# Patient Record
Sex: Male | Born: 1955 | Hispanic: No | Marital: Married | Smoking: Current every day smoker
Health system: Southern US, Community
[De-identification: ages and names within clinical notes are randomized; demographics above are authoritative.]

## PROBLEM LIST (undated history)

## (undated) DIAGNOSIS — M109 Gout, unspecified: Secondary | ICD-10-CM

## (undated) DIAGNOSIS — I10 Essential (primary) hypertension: Secondary | ICD-10-CM

---

## 2013-08-09 ENCOUNTER — Emergency Department (HOSPITAL_COMMUNITY)
Admission: EM | Admit: 2013-08-09 | Discharge: 2013-08-09 | Disposition: A | Discharge status: Home / Self Care | Payer: Self-pay | Attending: Emergency Medicine | Admitting: Emergency Medicine

## 2013-08-09 ENCOUNTER — Encounter (HOSPITAL_COMMUNITY): Payer: Self-pay | Admitting: Emergency Medicine

## 2013-08-09 ENCOUNTER — Emergency Department (HOSPITAL_COMMUNITY): Payer: Self-pay

## 2013-08-09 DIAGNOSIS — IMO0002 Reserved for concepts with insufficient information to code with codable children: Secondary | ICD-10-CM | POA: Insufficient documentation

## 2013-08-09 DIAGNOSIS — Z79899 Other long term (current) drug therapy: Secondary | ICD-10-CM | POA: Insufficient documentation

## 2013-08-09 DIAGNOSIS — F17200 Nicotine dependence, unspecified, uncomplicated: Secondary | ICD-10-CM | POA: Insufficient documentation

## 2013-08-09 DIAGNOSIS — I10 Essential (primary) hypertension: Secondary | ICD-10-CM | POA: Insufficient documentation

## 2013-08-09 DIAGNOSIS — R55 Syncope and collapse: Secondary | ICD-10-CM | POA: Insufficient documentation

## 2013-08-09 DIAGNOSIS — M109 Gout, unspecified: Secondary | ICD-10-CM | POA: Insufficient documentation

## 2013-08-09 DIAGNOSIS — R42 Dizziness and giddiness: Secondary | ICD-10-CM

## 2013-08-09 DIAGNOSIS — Z791 Long term (current) use of non-steroidal anti-inflammatories (NSAID): Secondary | ICD-10-CM | POA: Insufficient documentation

## 2013-08-09 DIAGNOSIS — R911 Solitary pulmonary nodule: Secondary | ICD-10-CM | POA: Insufficient documentation

## 2013-08-09 HISTORY — DX: Gout, unspecified: M10.9

## 2013-08-09 HISTORY — DX: Essential (primary) hypertension: I10

## 2013-08-09 LAB — CBC WITH DIFFERENTIAL/PLATELET
Basophils Absolute: 0 10*3/uL (ref 0.0–0.1)
Basophils Relative: 0 % (ref 0–1)
EOS PCT: 2 % (ref 0–5)
Eosinophils Absolute: 0.1 10*3/uL (ref 0.0–0.7)
HCT: 43.7 % (ref 39.0–52.0)
Hemoglobin: 15.7 g/dL (ref 13.0–17.0)
LYMPHS ABS: 1.4 10*3/uL (ref 0.7–4.0)
Lymphocytes Relative: 23 % (ref 12–46)
MCH: 33.1 pg (ref 26.0–34.0)
MCHC: 35.9 g/dL (ref 30.0–36.0)
MCV: 92.2 fL (ref 78.0–100.0)
MONO ABS: 0.6 10*3/uL (ref 0.1–1.0)
Monocytes Relative: 10 % (ref 3–12)
Neutro Abs: 4 10*3/uL (ref 1.7–7.7)
Neutrophils Relative %: 65 % (ref 43–77)
Platelets: 196 10*3/uL (ref 150–400)
RBC: 4.74 MIL/uL (ref 4.22–5.81)
RDW: 13.3 % (ref 11.5–15.5)
WBC: 6.1 10*3/uL (ref 4.0–10.5)

## 2013-08-09 LAB — BASIC METABOLIC PANEL
BUN: 15 mg/dL (ref 6–23)
CALCIUM: 8.5 mg/dL (ref 8.4–10.5)
CO2: 21 mEq/L (ref 19–32)
CREATININE: 0.97 mg/dL (ref 0.50–1.35)
Chloride: 103 mEq/L (ref 96–112)
GFR calc Af Amer: >90 mL/min (ref >90)
GLUCOSE: 116 mg/dL — AB (ref 70–99)
Potassium: 4.3 mEq/L (ref 3.7–5.3)
Sodium: 138 mEq/L (ref 137–147)

## 2013-08-09 LAB — TROPONIN I: Troponin I: <0.3 ng/mL (ref <0.30)

## 2013-08-09 MED ORDER — SODIUM CHLORIDE 0.9 % IV BOLUS (SEPSIS)
1000.0000 mL | Freq: Once | INTRAVENOUS | Status: AC
  Administered 2013-08-09: 1000 mL via INTRAVENOUS

## 2013-08-09 MED ORDER — ONDANSETRON HCL 4 MG/2ML IJ SOLN
4.0000 mg | Freq: Once | INTRAMUSCULAR | Status: AC
  Administered 2013-08-09: 4 mg via INTRAVENOUS
  Filled 2013-08-09: qty 2

## 2013-08-09 MED ORDER — MECLIZINE HCL 25 MG PO TABS
25.0000 mg | ORAL_TABLET | Freq: Once | ORAL | Status: AC
  Administered 2013-08-09: 25 mg via ORAL
  Filled 2013-08-09: qty 1

## 2013-08-09 MED ORDER — MECLIZINE HCL 25 MG PO TABS: 25.0000 mg | ORAL_TABLET | Freq: Four times a day (QID) | ORAL | Status: AC

## 2013-08-09 NOTE — ED Notes (Signed)
Pt able to ambulate up and down hallway with steady gait, denies dizziness at this time.

## 2013-08-09 NOTE — ED Notes (Signed)
Lab called, stated BMP hemolyzed. To be redrawn.

## 2013-08-09 NOTE — ED Provider Notes (Signed)
Medical screening examination/treatment/procedure(s) were performed by non-physician practitioner and as supervising physician I was immediately available for consultation/collaboration.  Cherri Yera E Ofelia Podolski, MD 08/09/13 2210 

## 2013-08-09 NOTE — ED Provider Notes (Signed)
CSN: [id number]     Arrival date & time 08/09/13  1719 History   First MD Initiated Contact with Patient 08/09/13 1727     Chief Complaint  Patient presents with  . Dizziness   (Consider location/radiation/quality/duration/timing/severity/associated sxs/prior Treatment) HPI  Patient presents to the ER by EMS with complaints of dizziness. He says that while he was sitting down watching television he started to get really dizzy as if he was rocking on a boat. He also felt as though his vision was getting fuzzy and he felt "not right". He became concerned and called the ambulance. He admits that this has happened to him before in the past. He denies passing out of being able to ambulate. He denies change in gait, weakness to one or two extremities. He denies confusion or aphagia. He denies recent illness, SOB, CP, vomiting, diarrhea, dysuria. He says he smokes but is otherwise pretty healthy.   Pt does admit that his dog of 15 years died yesterday and his family life has been stressful.   Past Medical History  Diagnosis Date  . Hypertension   . Gout    History reviewed. No pertinent past surgical history. Family History  Problem Relation Age of Onset  . Family history unknown: Yes   History  Substance Use Topics  . Smoking status: Current Every Day Smoker -- 1.00 packs/day    Types: Cigarettes  . Smokeless tobacco: Not on file  . Alcohol Use: Not on file    Review of Systems The patient denies anorexia, fever, weight loss,, vision loss, decreased hearing, hoarseness, chest pain, syncope, dyspnea on exertion, peripheral edema, balance deficits, hemoptysis, abdominal pain, melena, hematochezia, severe indigestion/heartburn, hematuria, incontinence, genital sores, muscle weakness, suspicious skin lesions, transient blindness, difficulty walking, depression, unusual weight change, abnormal bleeding, enlarged lymph nodes, angioedema, and breast masses.  Allergies  Review of patient's  allergies indicates no known allergies.  Home Medications   Current Outpatient Rx  Name  Route  Sig  Dispense  Refill  . betamethasone dipropionate (DIPROLENE) 0.05 % cream   Topical   Apply 1 application topically daily.         . indomethacin (INDOCIN) 50 MG capsule   Oral   Take 50 mg by mouth 3 (three) times daily between meals.         . metoprolol succinate (TOPROL-XL) 100 MG 24 hr tablet   Oral   Take 100 mg by mouth daily. Take with or immediately following a meal.         . meclizine (ANTIVERT) 25 MG tablet   Oral   Take 1 tablet (25 mg total) by mouth 4 (four) times daily.   14 tablet   0    BP 139/76  Pulse 68  Resp 16  SpO2 100% Physical Exam  Nursing note and vitals reviewed. Constitutional: He is oriented to person, place, and time. He appears well-developed and well-nourished. No distress.  HENT:  Head: Normocephalic and atraumatic.  Eyes: Pupils are equal, round, and reactive to light.  Neck: Normal range of motion. Neck supple.  Cardiovascular: Normal rate and regular rhythm.   Pulmonary/Chest: Effort normal.  Abdominal: Soft.  Neurological: He is alert and oriented to person, place, and time. He has normal strength. No cranial nerve deficit or sensory deficit. He displays a negative Romberg sign. GCS eye subscore is 4. GCS verbal subscore is 5. GCS motor subscore is 6.  Skin: Skin is warm and dry.    ED Course  Procedures (including critical care time) Labs Review Labs Reviewed  BASIC METABOLIC PANEL - Abnormal; Notable for the following:    Glucose, Bld 116 (*)    All other components within normal limits  CBC WITH DIFFERENTIAL  TROPONIN I   Imaging Review Dg Chest 2 View  08/09/2013   CLINICAL DATA:  Cough and dizziness  EXAM: CHEST  2 VIEW  COMPARISON:  None.  FINDINGS: Normal heart size and vascularity. Clear lungs. No focal pneumonia, collapse or consolidation. No edema, effusion or pneumothorax. Trachea midline. Minor  atherosclerosis of the aorta. Left midlung peripheral faint round nodule noted measures 6 mm. No available comparison studies to document stability. Recommend follow-up nonemergent chest CT to exclude pulmonary nodule.  IMPRESSION: No acute chest finding.  6 mm left midlung pulmonary nodule. See above comment and recommendation.   Electronically Signed   By: Ruel Favorsrevor  Shick M.D.   On: 08/09/2013 18:43    EKG Interpretation    Date/Time:  Thursday August 09 2013 19:01:22 EST Ventricular Rate:  84 PR Interval:  199 QRS Duration: 90 QT Interval:  381 QTC Calculation: 450 R Axis:   67 Text Interpretation:  Sinus rhythm EKG WITHIN NORMAL LIMITS No old tracing to compare Confirmed by DOCHERTY  MD, MEGAN [id number]) on 08/09/2013 7:05:58 PM            MDM   1. Vertigo       Patient given a liter of fluids, zofran and Antivert. He says that his symptoms have completely resolved and he is feeling much better. His vital signs are all WNL and his lab work is reassuring. His chest xray shows a small pulmonary nodule which the radiologist recommends havign a follow-up CT discussed this with patient he has a PCP in [place] he will follow-up with. Pt is a smoker so this is concerning but not to be the cause of his visit today. Pt ambulated in room without difficulty. EKG WNL, pt safe to dc. Will give rx of Antivert.  58 y.o.Roxan Hockeyobinson Ring's evaluation in the Emergency Department is complete. It has been determined that no acute conditions requiring further emergency intervention are present at this time. The patient/guardian have been advised of the diagnosis and plan. We have discussed signs and symptoms that warrant return to the ED, such as changes or worsening in symptoms.  Vital signs are stable at discharge. Filed Vitals:   08/09/13 1916  BP: 139/76  Pulse: 68  Resp: 16    Patient/guardian has voiced understanding and agreed to follow-up with the PCP or specialist.    Dorthula Matasiffany G  Noella Kipnis, PA-C 08/09/13 2018

## 2013-08-09 NOTE — ED Notes (Signed)
Labs redrawn, sent to lab. 

## 2013-08-09 NOTE — ED Notes (Signed)
Patient refused to stand up. Pt states "I'm dizzy lady I can't stand" nurse aware.

## 2013-08-09 NOTE — ED Notes (Signed)
Bed: [id number] Expected date:  Expected time:  Means of arrival:  Comments: EMS- Near syncope

## 2013-08-09 NOTE — Discharge Instructions (Signed)
Dizziness Dizziness is a common problem. It is a feeling of unsteadiness or lightheadedness. You may feel like you are about to faint. Dizziness can lead to injury if you stumble or fall. A person of any age group can suffer from dizziness, but dizziness is more common in older adults. CAUSES  Dizziness can be caused by many different things, including: Vertigo Vertigo means you feel like you or your surroundings are moving when they are not. Vertigo can be dangerous if it occurs when you are at work, driving, or performing difficult activities.  CAUSES  Vertigo occurs when there is a conflict of signals sent to your brain from the visual and sensory systems in your body. There are many different causes of vertigo, including:  Infections, especially in the inner ear.  A bad reaction to a drug or misuse of alcohol and medicines.  Withdrawal from drugs or alcohol.  Rapidly changing positions, such as lying down or rolling over in bed.  A migraine headache.  Decreased blood flow to the brain.  Increased pressure in the brain from a head injury, infection, tumor, or bleeding. SYMPTOMS  You may feel as though the world is spinning around or you are falling to the ground. Because your balance is upset, vertigo can cause nausea and vomiting. You may have involuntary eye movements (nystagmus). DIAGNOSIS  Vertigo is usually diagnosed by physical exam. If the cause of your vertigo is unknown, your caregiver may perform imaging tests, such as an MRI scan (magnetic resonance imaging). TREATMENT  Most cases of vertigo resolve on their own, without treatment. Depending on the cause, your caregiver may prescribe certain medicines. If your vertigo is related to body position issues, your caregiver may recommend movements or procedures to correct the problem. In rare cases, if your vertigo is caused by certain inner ear problems, you may need surgery. HOME CARE INSTRUCTIONS   Follow your caregiver's  instructions.  Avoid driving.  Avoid operating heavy machinery.  Avoid performing any tasks that would be dangerous to you or others during a vertigo episode.  Tell your caregiver if you notice that certain medicines seem to be causing your vertigo. Some of the medicines used to treat vertigo episodes can actually make them worse in some people. SEEK IMMEDIATE MEDICAL CARE IF:   Your medicines do not relieve your vertigo or are making it worse.  You develop problems with talking, walking, weakness, or using your arms, hands, or legs.  You develop severe headaches.  Your nausea or vomiting continues or gets worse.  You develop visual changes.  A family member notices behavioral changes.  Your condition gets worse. MAKE SURE YOU:  Understand these instructions.  Will watch your condition.  Will get help right away if you are not doing well or get worse. Document Released: 04/07/2005 Document Revised: 09/20/2011 Document Reviewed: 01/14/2011 [facility] Patient Information 2014 [place], [place].   Middle ear problems.  Standing for too long.  Infections.  An allergic reaction.  Aging.  An emotional response to something, such as the sight of blood.  Side effects of medicines.  Fatigue.  Problems with circulation or blood pressure.  Excess use of alcohol, medicines, or illegal drug use.  Breathing too fast (hyperventilation).  An arrhythmia or problems with your heart rhythm.  Low red blood cell count (anemia).  Pregnancy.  Vomiting, diarrhea, fever, or other illnesses that cause dehydration.  Diseases or conditions such as Parkinson's disease, high blood pressure (hypertension), diabetes, and thyroid problems.  Exposure to  extreme heat. DIAGNOSIS  To find the cause of your dizziness, your caregiver may do a physical exam, lab tests, radiologic imaging scans, or an electrocardiography test (ECG).  TREATMENT  Treatment of dizziness depends on the cause of  your symptoms and can vary greatly. HOME CARE INSTRUCTIONS   Drink enough fluids to keep your urine clear or pale yellow. This is especially important in very hot weather. In the elderly, it is also important in cold weather.  If your dizziness is caused by medicines, take them exactly as directed. When taking blood pressure medicines, it is especially important to get up slowly.  Rise slowly from chairs and steady yourself until you feel okay.  In the morning, first sit up on the side of the bed. When this seems okay, stand slowly while holding onto something until you know your balance is fine.  If you need to stand in one place for a long time, be sure to move your legs often. Tighten and relax the muscles in your legs while standing.  If dizziness continues to be a problem, have someone stay with you for a day or two. Do this until you feel you are well enough to stay alone. Have the person call your caregiver if he or she notices changes in you that are concerning.  Do not drive or use heavy machinery if you feel dizzy.  Do not drink alcohol. SEEK IMMEDIATE MEDICAL CARE IF:   Your dizziness or lightheadedness gets worse.  You feel nauseous or vomit.  You develop problems with talking, walking, weakness, or using your arms, hands, or legs.  You are not thinking clearly or you have difficulty forming sentences. It may take a friend or family member to determine if your thinking is normal.  You develop chest pain, abdominal pain, shortness of breath, or sweating.  Your vision changes.  You notice any bleeding.  You have side effects from medicine that seems to be getting worse rather than better. MAKE SURE YOU:   Understand these instructions.  Will watch your condition.  Will get help right away if you are not doing well or get worse. Document Released: 12/22/2000 Document Revised: 09/20/2011 Document Reviewed: 01/15/2011 [facility] Patient Information 2014 [place]

## 2013-08-09 NOTE — ED Notes (Signed)
PA at bedside.

## 2013-08-09 NOTE — ED Notes (Addendum)
Per EMS, Pt, from home, c/o dizziness x 1 day.  Denies pain.  Pt sts "it feels like the room is spinning."  BP 178/105.  Hx of HTN, non compliant.  EMS sts Pt reported being under increased stress from family and his dog recently died.

## 2015-06-28 IMAGING — CR DG CHEST 2V
2 series · 2 of 2 positions shown · non-contrast
Comparison: None.

CLINICAL DATA: Cough and dizziness

EXAM:
CHEST  2 VIEW

[w chest pa]
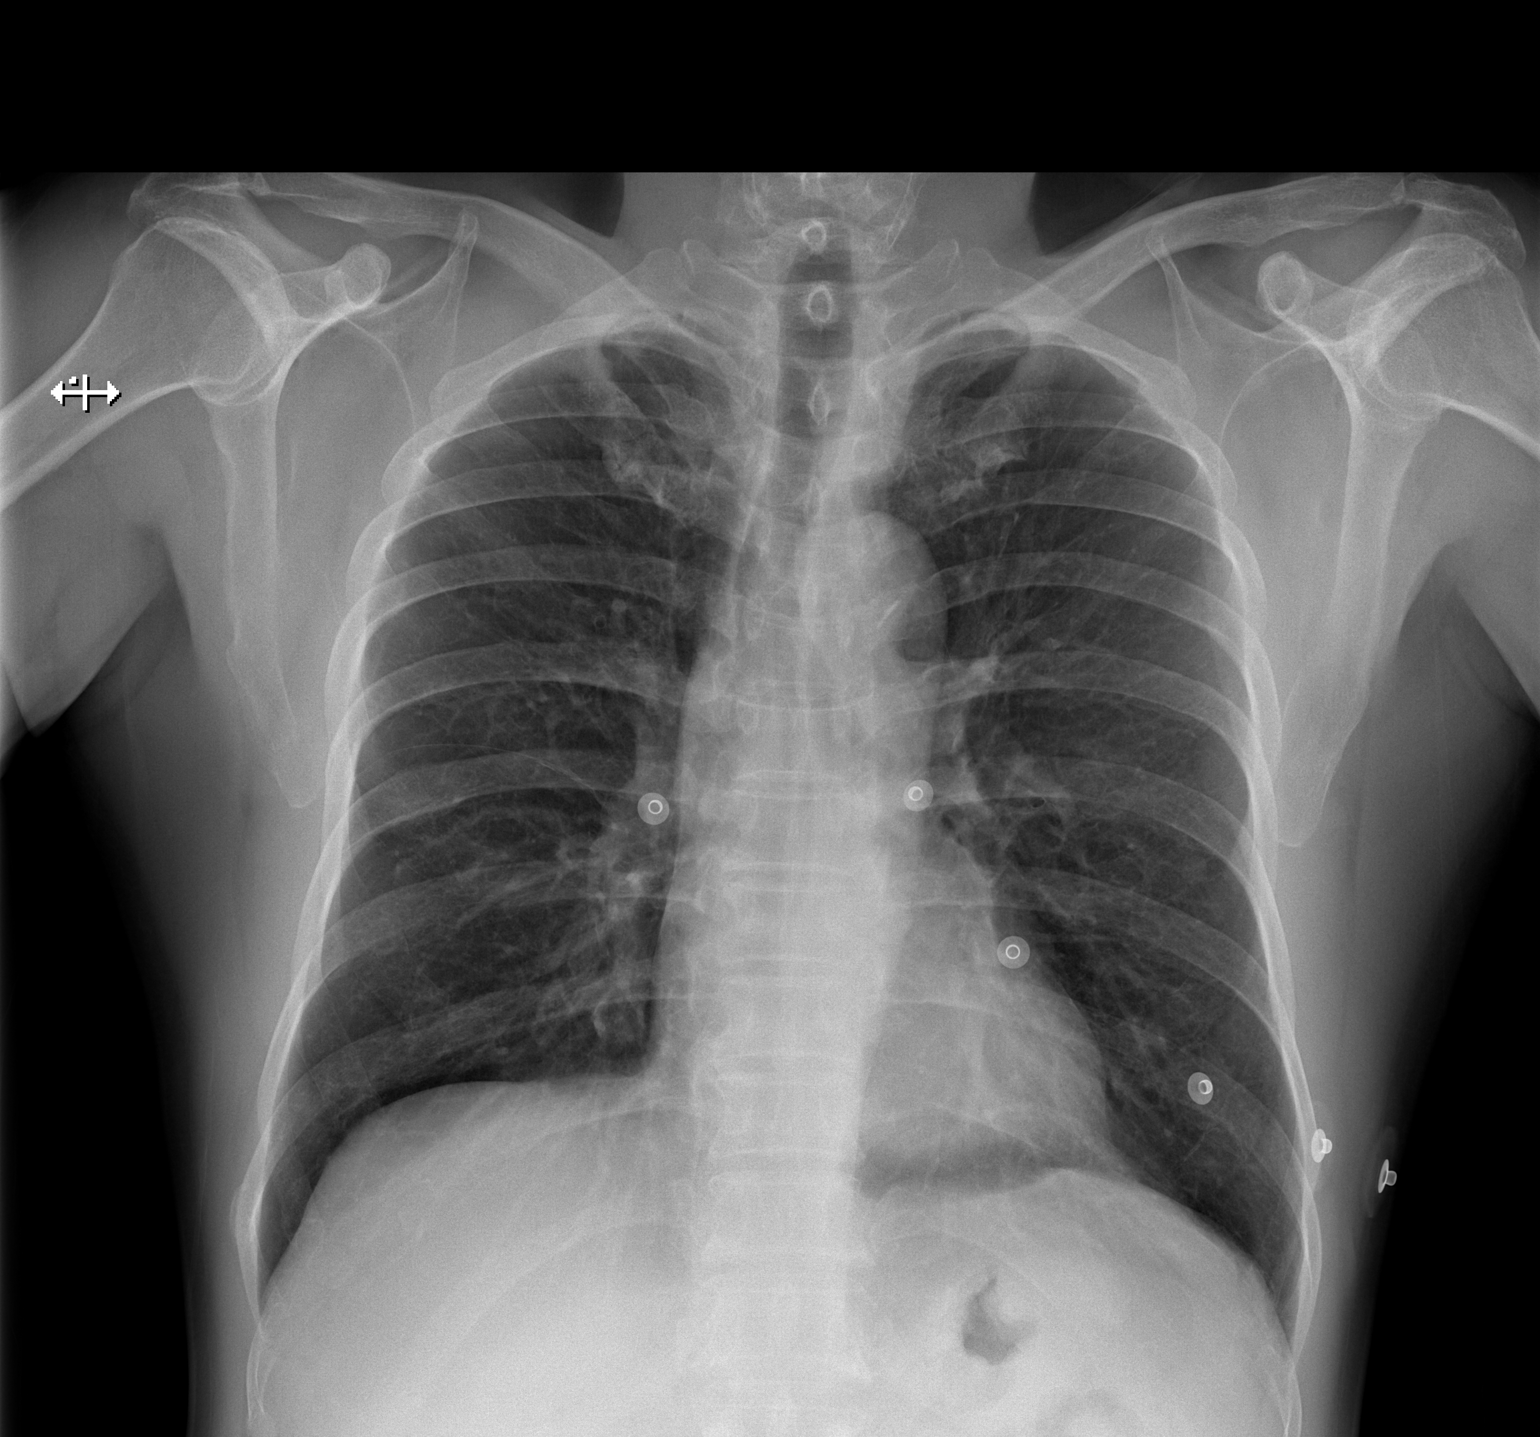

[w chest lat]
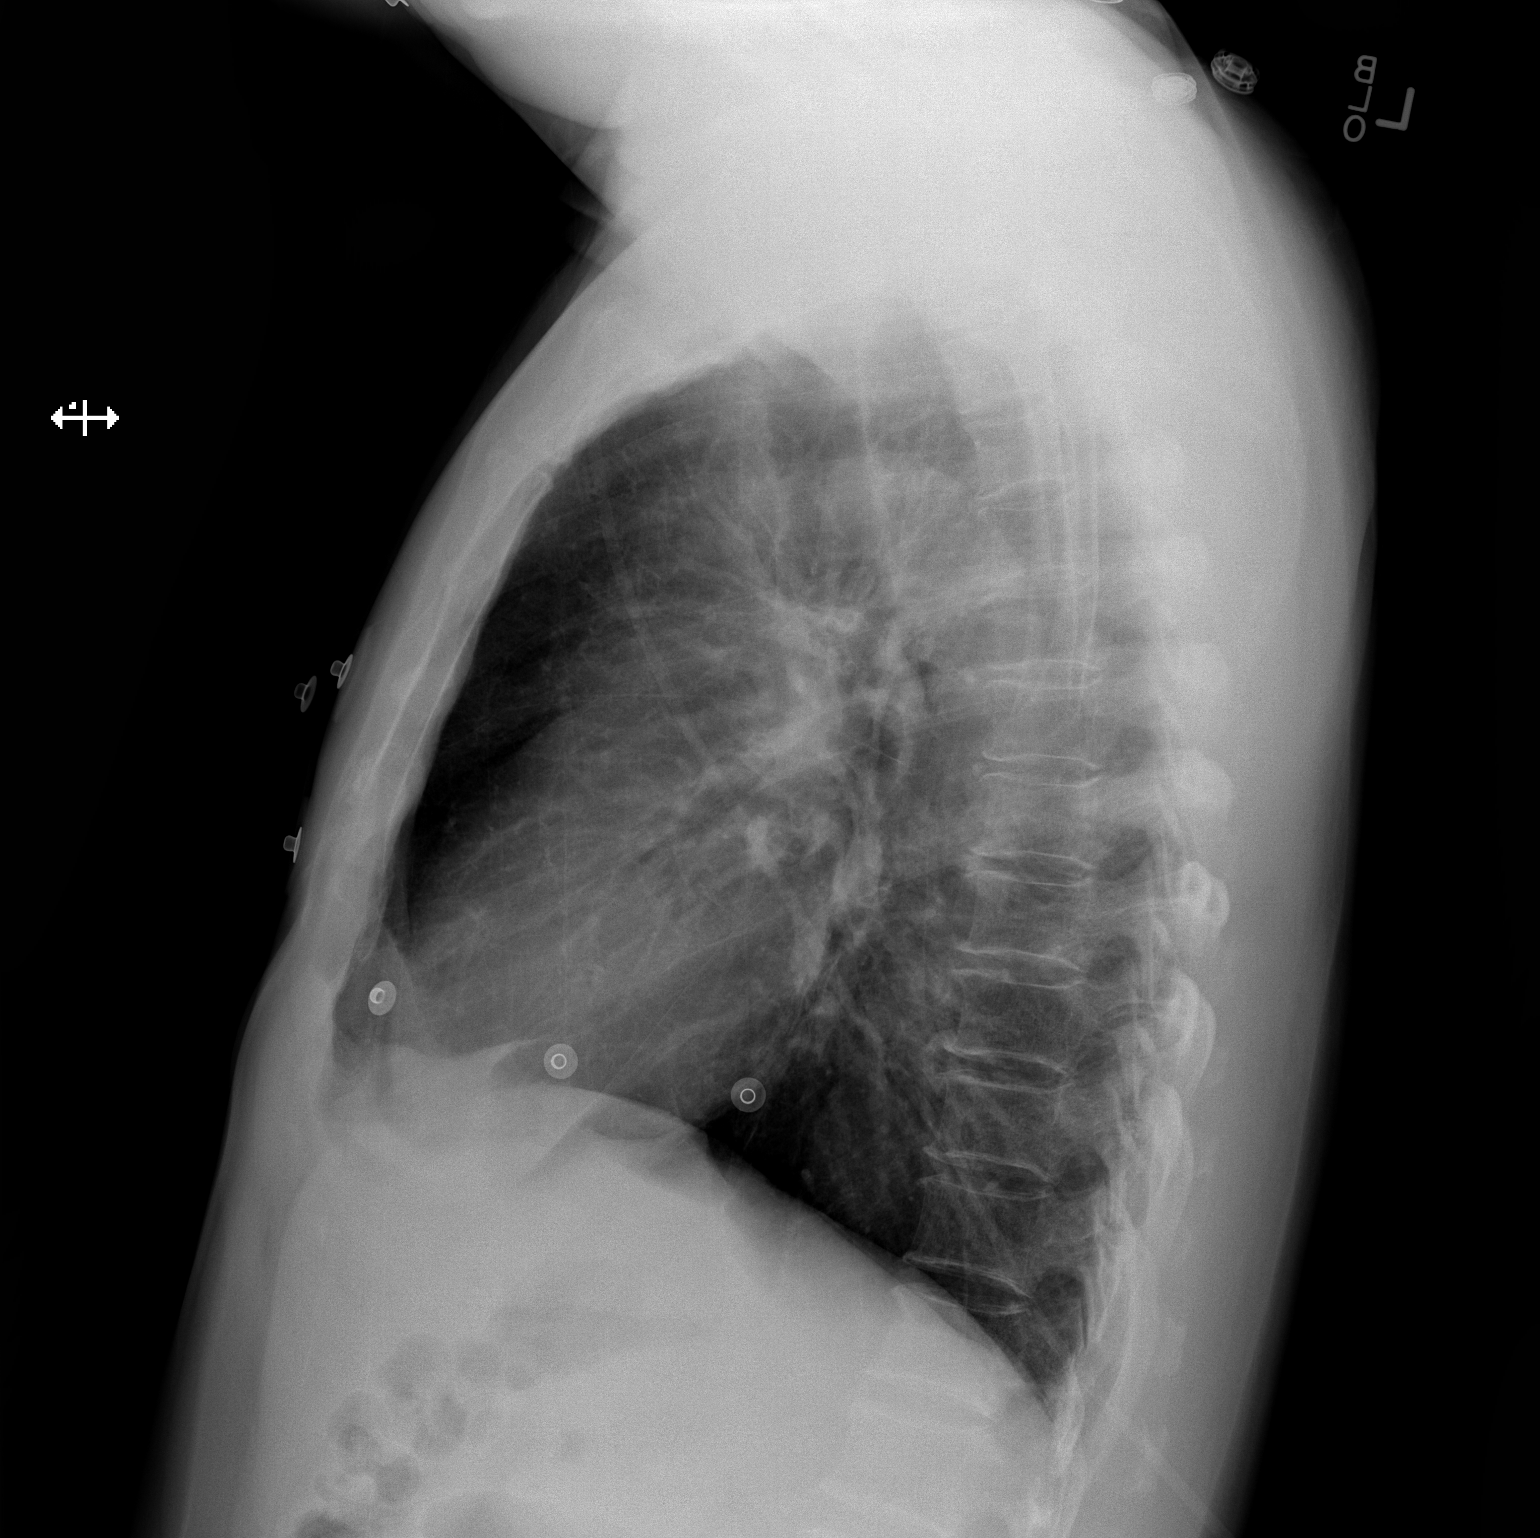

[2 of 2 positions shown; findings below may reference images not displayed]

FINDINGS: Normal heart size and vascularity. Clear lungs. No focal pneumonia,
collapse or consolidation. No edema, effusion or pneumothorax.
Trachea midline. Minor atherosclerosis of the aorta. Left midlung
peripheral faint round nodule noted measures 6 mm. No available
comparison studies to document stability. Recommend follow-up
nonemergent chest CT to exclude pulmonary nodule.
IMPRESSION: No acute chest finding.

6 mm left midlung pulmonary nodule. See above comment and
recommendation.
